# Patient Record
Sex: Male | Born: 1985 | Race: Black or African American | Hispanic: No | Marital: Single | Smoking: Never smoker
Health system: Southern US, Community
[De-identification: ages and names within clinical notes are randomized; demographics above are authoritative.]

---

## 2012-02-26 ENCOUNTER — Encounter: Payer: Self-pay | Admitting: Family Medicine

## 2012-04-26 ENCOUNTER — Ambulatory Visit (INDEPENDENT_AMBULATORY_CARE_PROVIDER_SITE_OTHER): Payer: BC Managed Care – PPO | Admitting: Family Medicine

## 2012-04-26 VITALS — BP 114/77 | HR 68 | Temp 98.0°F | Resp 16 | Ht 70.0 in | Wt 190.0 lb

## 2012-04-26 DIAGNOSIS — Z0000 Encounter for general adult medical examination without abnormal findings: Secondary | ICD-10-CM

## 2012-04-26 LAB — COMPREHENSIVE METABOLIC PANEL
ALT: 21 U/L (ref 0–53)
Albumin: 5.1 g/dL (ref 3.5–5.2)
CO2: 30 mEq/L (ref 19–32)
Calcium: 9.9 mg/dL (ref 8.4–10.5)
Chloride: 100 mEq/L (ref 96–112)
Glucose, Bld: 96 mg/dL (ref 70–99)
Potassium: 4.2 mEq/L (ref 3.5–5.3)
Sodium: 140 mEq/L (ref 135–145)
Total Protein: 8.1 g/dL (ref 6.0–8.3)

## 2012-04-26 LAB — LIPID PANEL
Cholesterol: 215 mg/dL — ABNORMAL HIGH (ref 0–200)
Triglycerides: 123 mg/dL (ref <150)

## 2012-04-26 NOTE — Progress Notes (Signed)
  Subjective:    Patient ID: Allen Poole, male    DOB: 04/21/1986, 26 y.o.   MRN: [id number]  HPI  Patient presents for CPE  1) Vertiginous symptoms X 2 months. Symptoms have resolved in entirely. Occurred after infectious illness.  PMH/ No chronic medical illness            No surgical history             Immunizations UTD(updated prior to immigration 2009)   SH/ Nurse- [facility]; from [place] immigrated in         nonsmoker Review of Systems    See scanned intake form Objective:   Physical Exam  Constitutional: He appears well-developed and well-nourished.  HENT:  Head: Normocephalic and atraumatic.  Right Ear: External ear normal.  Left Ear: External ear normal.  Nose: Nose normal.  Mouth/Throat: Oropharynx is clear and moist.  Eyes: EOM are normal. Pupils are equal, round, and reactive to light.  Neck: Neck supple. No thyromegaly present.  Cardiovascular: Normal rate, regular rhythm and normal heart sounds.   Pulmonary/Chest: Effort normal and breath sounds normal.  Abdominal: Soft. Bowel sounds are normal. He exhibits no mass. There is no hepatosplenomegaly. There is no tenderness. No hernia.  Musculoskeletal: Normal range of motion.  Lymphadenopathy:    He has no cervical adenopathy.  Neurological: He is alert.  Skin: Skin is warm.  Psychiatric: He has a normal mood and affect.           Assessment & Plan:   1. Routine general medical examination at a health care facility  HIV antibody, GC/chlamydia probe amp, urine, RPR, Comprehensive metabolic panel, Lipid panel   Patient interested in blood typing but will pursue this possibly with [facility]  Reassurance and guidance regarding recent vertgo

## 2012-04-27 LAB — RPR

## 2012-04-27 LAB — HIV ANTIBODY (ROUTINE TESTING W REFLEX): HIV: NONREACTIVE

## 2012-04-28 ENCOUNTER — Other Ambulatory Visit: Payer: Self-pay | Admitting: Family Medicine

## 2012-04-28 DIAGNOSIS — A749 Chlamydial infection, unspecified: Secondary | ICD-10-CM

## 2012-04-28 MED ORDER — DOXYCYCLINE HYCLATE 100 MG PO TABS: 100.0000 mg | ORAL_TABLET | Freq: Two times a day (BID) | ORAL | Status: AC

## 2013-08-31 ENCOUNTER — Emergency Department (HOSPITAL_BASED_OUTPATIENT_CLINIC_OR_DEPARTMENT_OTHER)
Admission: EM | Admit: 2013-08-31 | Discharge: 2013-09-01 | Disposition: A | Discharge status: Home / Self Care | Payer: No Typology Code available for payment source | Attending: Emergency Medicine | Admitting: Emergency Medicine

## 2013-08-31 ENCOUNTER — Encounter (HOSPITAL_BASED_OUTPATIENT_CLINIC_OR_DEPARTMENT_OTHER): Payer: Self-pay

## 2013-08-31 ENCOUNTER — Emergency Department (HOSPITAL_BASED_OUTPATIENT_CLINIC_OR_DEPARTMENT_OTHER): Payer: No Typology Code available for payment source

## 2013-08-31 DIAGNOSIS — Y9389 Activity, other specified: Secondary | ICD-10-CM | POA: Insufficient documentation

## 2013-08-31 DIAGNOSIS — S20219A Contusion of unspecified front wall of thorax, initial encounter: Secondary | ICD-10-CM | POA: Insufficient documentation

## 2013-08-31 DIAGNOSIS — S0993XA Unspecified injury of face, initial encounter: Secondary | ICD-10-CM | POA: Insufficient documentation

## 2013-08-31 DIAGNOSIS — S8010XA Contusion of unspecified lower leg, initial encounter: Secondary | ICD-10-CM | POA: Insufficient documentation

## 2013-08-31 DIAGNOSIS — S8990XA Unspecified injury of unspecified lower leg, initial encounter: Secondary | ICD-10-CM | POA: Insufficient documentation

## 2013-08-31 DIAGNOSIS — S3981XA Other specified injuries of abdomen, initial encounter: Secondary | ICD-10-CM | POA: Insufficient documentation

## 2013-08-31 DIAGNOSIS — Y92410 Unspecified street and highway as the place of occurrence of the external cause: Secondary | ICD-10-CM | POA: Insufficient documentation

## 2013-08-31 DIAGNOSIS — S20211A Contusion of right front wall of thorax, initial encounter: Secondary | ICD-10-CM

## 2013-08-31 DIAGNOSIS — IMO0002 Reserved for concepts with insufficient information to code with codable children: Secondary | ICD-10-CM | POA: Insufficient documentation

## 2013-08-31 DIAGNOSIS — S8011XA Contusion of right lower leg, initial encounter: Secondary | ICD-10-CM

## 2013-08-31 NOTE — ED Notes (Signed)
Patient transported to X-ray 
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
## 2013-09-01 MED ORDER — HYDROCODONE-ACETAMINOPHEN 5-325 MG PO TABS
2.0000 | ORAL_TABLET | Freq: Once | ORAL | Status: AC
  Administered 2013-09-01: 2 via ORAL
  Filled 2013-09-01: qty 2

## 2013-09-01 MED ORDER — HYDROCODONE-ACETAMINOPHEN 5-325 MG PO TABS: 2.0000 | ORAL_TABLET | ORAL | Status: DC | PRN

## 2013-09-04 ENCOUNTER — Other Ambulatory Visit: Payer: Self-pay | Admitting: Orthopaedic Surgery

## 2013-09-04 DIAGNOSIS — M542 Cervicalgia: Secondary | ICD-10-CM

## 2013-09-06 ENCOUNTER — Ambulatory Visit
Admission: RE | Admit: 2013-09-06 | Discharge: 2013-09-06 | Disposition: A | Discharge status: Home / Self Care | Payer: No Typology Code available for payment source | Source: Ambulatory Visit | Attending: Orthopaedic Surgery | Admitting: Orthopaedic Surgery

## 2013-09-06 DIAGNOSIS — M542 Cervicalgia: Secondary | ICD-10-CM

## 2015-08-02 ENCOUNTER — Encounter (HOSPITAL_BASED_OUTPATIENT_CLINIC_OR_DEPARTMENT_OTHER): Payer: Self-pay | Admitting: *Deleted

## 2015-08-02 ENCOUNTER — Emergency Department (HOSPITAL_BASED_OUTPATIENT_CLINIC_OR_DEPARTMENT_OTHER): Payer: No Typology Code available for payment source

## 2015-08-02 ENCOUNTER — Emergency Department (HOSPITAL_BASED_OUTPATIENT_CLINIC_OR_DEPARTMENT_OTHER)
Admission: EM | Admit: 2015-08-02 | Discharge: 2015-08-02 | Disposition: A | Discharge status: Home / Self Care | Payer: No Typology Code available for payment source | Attending: Emergency Medicine | Admitting: Emergency Medicine

## 2015-08-02 DIAGNOSIS — S161XXA Strain of muscle, fascia and tendon at neck level, initial encounter: Secondary | ICD-10-CM | POA: Diagnosis not present

## 2015-08-02 DIAGNOSIS — S39012A Strain of muscle, fascia and tendon of lower back, initial encounter: Secondary | ICD-10-CM | POA: Insufficient documentation

## 2015-08-02 DIAGNOSIS — Y9389 Activity, other specified: Secondary | ICD-10-CM | POA: Diagnosis not present

## 2015-08-02 DIAGNOSIS — Y92410 Unspecified street and highway as the place of occurrence of the external cause: Secondary | ICD-10-CM | POA: Insufficient documentation

## 2015-08-02 DIAGNOSIS — Y998 Other external cause status: Secondary | ICD-10-CM | POA: Diagnosis not present

## 2015-08-02 DIAGNOSIS — S199XXA Unspecified injury of neck, initial encounter: Secondary | ICD-10-CM | POA: Diagnosis present

## 2015-08-02 MED ORDER — NAPROXEN 500 MG PO TABS: 500.0000 mg | ORAL_TABLET | Freq: Two times a day (BID) | ORAL | Status: AC

## 2015-08-02 MED ORDER — METHOCARBAMOL 500 MG PO TABS: 500.0000 mg | ORAL_TABLET | Freq: Two times a day (BID) | ORAL | Status: AC
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
## 2015-08-02 NOTE — Discharge Instructions (Signed)
Cervical Sprain °A cervical sprain is an injury in the neck in which the strong, fibrous tissues (ligaments) that connect your neck bones stretch or tear. Cervical sprains can range from mild to severe. Severe cervical sprains can cause the neck vertebrae to be unstable. This can lead to damage of the spinal cord and can result in serious nervous system problems. The amount of time it takes for a cervical sprain to get better depends on the cause and extent of the injury. Most cervical sprains heal in 1 to 3 weeks. °CAUSES  °Severe cervical sprains may be caused by:  °· Contact sport injuries (such as from football, rugby, wrestling, hockey, auto racing, gymnastics, diving, martial arts, or boxing).   °· Motor vehicle collisions.   °· Whiplash injuries. This is an injury from a sudden forward and backward whipping movement of the head and neck.  °· Falls.   °Mild cervical sprains may be caused by:  °· Being in an awkward position, such as while cradling a telephone between your ear and shoulder.   °· Sitting in a chair that does not offer proper support.   °· Working at a poorly designed computer station.   °· Looking up or down for long periods of time.   °SYMPTOMS  °· Pain, soreness, stiffness, or a burning sensation in the front, back, or sides of the neck. This discomfort may develop immediately after the injury or slowly, 24 hours or more after the injury.   °· Pain or tenderness directly in the middle of the back of the neck.   °· Shoulder or upper back pain.   °· Limited ability to move the neck.   °· Headache.   °· Dizziness.   °· Weakness, numbness, or tingling in the hands or arms.   °· Muscle spasms.   °· Difficulty swallowing or chewing.   °· Tenderness and swelling of the neck.   °DIAGNOSIS  °Most of the time your health care provider can diagnose a cervical sprain by taking your history and doing a physical exam. Your health care provider will ask about previous neck injuries and any known neck  problems, such as arthritis in the neck. X-rays may be taken to find out if there are any other problems, such as with the bones of the neck. Other tests, such as a CT scan or MRI, may also be needed.  °TREATMENT  °Treatment depends on the severity of the cervical sprain. Mild sprains can be treated with rest, keeping the neck in place (immobilization), and pain medicines. Severe cervical sprains are immediately immobilized. Further treatment is done to help with pain, muscle spasms, and other symptoms and may include: °· Medicines, such as pain relievers, numbing medicines, or muscle relaxants.   °· Physical therapy. This may involve stretching exercises, strengthening exercises, and posture training. Exercises and improved posture can help stabilize the neck, strengthen muscles, and help stop symptoms from returning.   °HOME CARE INSTRUCTIONS  °· Put ice on the injured area.   °· Put ice in a plastic bag.   °· Place a towel between your skin and the bag.   °· Leave the ice on for 15-20 minutes, 3-4 times a day.   °· If your injury was severe, you may have been given a cervical collar to wear. A cervical collar is a two-piece collar designed to keep your neck from moving while it heals. °· Do not remove the collar unless instructed by your health care provider. °· If you have long hair, keep it outside of the collar. °· Ask your health care provider before making any adjustments to your collar. Minor   adjustments may be required over time to improve comfort and reduce pressure on your chin or on the back of your head. °· If you are allowed to remove the collar for cleaning or bathing, follow your health care provider's instructions on how to do so safely. °· Keep your collar clean by wiping it with mild soap and water and drying it completely. If the collar you have been given includes removable pads, remove them every 1-2 days and hand wash them with soap and water. Allow them to air dry. They should be completely  dry before you wear them in the collar. °· If you are allowed to remove the collar for cleaning and bathing, wash and dry the skin of your neck. Check your skin for irritation or sores. If you see any, tell your health care provider. °· Do not drive while wearing the collar.   °· Only take over-the-counter or prescription medicines for pain, discomfort, or fever as directed by your health care provider.   °· Keep all follow-up appointments as directed by your health care provider.   °· Keep all physical therapy appointments as directed by your health care provider.   °· Make any needed adjustments to your workstation to promote good posture.   °· Avoid positions and activities that make your symptoms worse.   °· Warm up and stretch before being active to help prevent problems.   °SEEK MEDICAL CARE IF:  °· Your pain is not controlled with medicine.   °· You are unable to decrease your pain medicine over time as planned.   °· Your activity level is not improving as expected.   °SEEK IMMEDIATE MEDICAL CARE IF:  °· You develop any bleeding. °· You develop stomach upset. °· You have signs of an allergic reaction to your medicine.   °· Your symptoms get worse.   °· You develop new, unexplained symptoms.   °· You have numbness, tingling, weakness, or paralysis in any part of your body.   °MAKE SURE YOU:  °· Understand these instructions. °· Will watch your condition. °· Will get help right away if you are not doing well or get worse. °Document Released: 09/27/2007 Document Revised: 12/05/2013 Document Reviewed: 06/07/2013 °[employer]® Patient Information ©2015 [employer]. This information is not intended to replace advice given to you by your health care provider. Make sure you discuss any questions you have with your health care provider. ° °Lumbosacral Strain °Lumbosacral strain is a strain of any of the parts that make up your lumbosacral vertebrae. Your lumbosacral vertebrae are the bones that make up the lower third  of your backbone. Your lumbosacral vertebrae are held together by muscles and tough, fibrous tissue (ligaments).  °CAUSES  °A sudden blow to your back can cause lumbosacral strain. Also, anything that causes an excessive stretch of the muscles in the low back can cause this strain. This is typically seen when people exert themselves strenuously, fall, lift heavy objects, bend, or crouch repeatedly. °RISK FACTORS °· Physically demanding work. °· Participation in pushing or pulling sports or sports that require a sudden twist of the back (tennis, golf, baseball). °· Weight lifting. °· Excessive lower back curvature. °· Forward-tilted pelvis. °· Weak back or abdominal muscles or both. °· Tight hamstrings. °SIGNS AND SYMPTOMS  °Lumbosacral strain may cause pain in the area of your injury or pain that moves (radiates) down your leg.  °DIAGNOSIS °Your health care provider can often diagnose lumbosacral strain through a physical exam. In some cases, you may need tests such as X-ray exams.  °TREATMENT  °Treatment for your lower   back injury depends on many factors that your clinician will have to evaluate. However, most treatment will include the use of anti-inflammatory medicines. °HOME CARE INSTRUCTIONS  °· Avoid hard physical activities (tennis, racquetball, waterskiing) if you are not in proper physical condition for it. This may aggravate or create problems. °· If you have a back problem, avoid sports requiring sudden body movements. Swimming and walking are generally safer activities. °· Maintain good posture. °· Maintain a healthy weight. °· For acute conditions, you may put ice on the injured area. °¨ Put ice in a plastic bag. °¨ Place a towel between your skin and the bag. °¨ Leave the ice on for 20 minutes, 2-3 times a day. °· When the low back starts healing, stretching and strengthening exercises may be recommended. °SEEK MEDICAL CARE IF: °· Your back pain is getting worse. °· You experience severe back pain not  relieved with medicines. °SEEK IMMEDIATE MEDICAL CARE IF:  °· You have numbness, tingling, weakness, or problems with the use of your arms or legs. °· There is a change in bowel or bladder control. °· You have increasing pain in any area of the body, including your belly (abdomen). °· You notice shortness of breath, dizziness, or feel faint. °· You feel sick to your stomach (nauseous), are throwing up (vomiting), or become sweaty. °· You notice discoloration of your toes or legs, or your feet get very cold. °MAKE SURE YOU:  °· Understand these instructions. °· Will watch your condition. °· Will get help right away if you are not doing well or get worse. °Document Released: 09/09/2005 Document Revised: 12/05/2013 Document Reviewed: 07/19/2013 °Exit[employer]® Patient Information ©2015 [employer]. This information is not intended to replace advice given to you by your health care provider. Make sure you discuss any questions you have with your health care provider. ° °

## 2015-08-24 ENCOUNTER — Encounter (HOSPITAL_BASED_OUTPATIENT_CLINIC_OR_DEPARTMENT_OTHER): Payer: Self-pay | Admitting: Emergency Medicine

## 2015-08-24 ENCOUNTER — Emergency Department (HOSPITAL_BASED_OUTPATIENT_CLINIC_OR_DEPARTMENT_OTHER)
Admission: EM | Admit: 2015-08-24 | Discharge: 2015-08-24 | Disposition: A | Discharge status: Home / Self Care | Payer: No Typology Code available for payment source | Attending: Emergency Medicine | Admitting: Emergency Medicine

## 2015-08-24 DIAGNOSIS — Y92410 Unspecified street and highway as the place of occurrence of the external cause: Secondary | ICD-10-CM | POA: Diagnosis not present

## 2015-08-24 DIAGNOSIS — S8991XA Unspecified injury of right lower leg, initial encounter: Secondary | ICD-10-CM | POA: Diagnosis not present

## 2015-08-24 DIAGNOSIS — G8929 Other chronic pain: Secondary | ICD-10-CM | POA: Insufficient documentation

## 2015-08-24 DIAGNOSIS — M25561 Pain in right knee: Secondary | ICD-10-CM

## 2015-08-24 DIAGNOSIS — Y998 Other external cause status: Secondary | ICD-10-CM | POA: Diagnosis not present

## 2015-08-24 DIAGNOSIS — Y9389 Activity, other specified: Secondary | ICD-10-CM | POA: Insufficient documentation

## 2015-08-24 MED ORDER — METHOCARBAMOL 500 MG PO TABS
1000.0000 mg | ORAL_TABLET | Freq: Once | ORAL | Status: AC
  Administered 2015-08-24: 1000 mg via ORAL
  Filled 2015-08-24: qty 2

## 2015-08-24 MED ORDER — MELOXICAM 15 MG PO TABS: 15.0000 mg | ORAL_TABLET | Freq: Every day | ORAL | Status: AC

## 2015-08-24 MED ORDER — NAPROXEN 250 MG PO TABS
500.0000 mg | ORAL_TABLET | Freq: Once | ORAL | Status: AC
  Administered 2015-08-24: 500 mg via ORAL
  Filled 2015-08-24: qty 2

## 2015-08-24 MED ORDER — METHOCARBAMOL 500 MG PO TABS: 500.0000 mg | ORAL_TABLET | Freq: Two times a day (BID) | ORAL | Status: AC

## 2015-08-24 NOTE — Discharge Instructions (Signed)
Cryotherapy °Cryotherapy means treatment with cold. Ice or gel packs can be used to reduce both pain and swelling. Ice is the most helpful within the first 24 to 48 hours after an injury or flare-up from overusing a muscle or joint. Sprains, strains, spasms, burning pain, shooting pain, and aches can all be eased with ice. Ice can also be used when recovering from surgery. Ice is effective, has very few side effects, and is safe for most people to use. °PRECAUTIONS  °Ice is not a safe treatment option for people with: °· Raynaud phenomenon. This is a condition affecting small blood vessels in the extremities. Exposure to cold may cause your problems to return. °· Cold hypersensitivity. There are many forms of cold hypersensitivity, including: °¨ Cold urticaria. Red, itchy hives appear on the skin when the tissues begin to warm after being iced. °¨ Cold erythema. This is a red, itchy rash caused by exposure to cold. °¨ Cold hemoglobinuria. Red blood cells break down when the tissues begin to warm after being iced. The hemoglobin that carry oxygen are passed into the urine because they cannot combine with blood proteins fast enough. °· Numbness or altered sensitivity in the area being iced. °If you have any of the following conditions, do not use ice until you have discussed cryotherapy with your caregiver: °· Heart conditions, such as arrhythmia, angina, or chronic heart disease. °· High blood pressure. °· Healing wounds or open skin in the area being iced. °· Current infections. °· Rheumatoid arthritis. °· Poor circulation. °· Diabetes. °Ice slows the blood flow in the region it is applied. This is beneficial when trying to stop inflamed tissues from spreading irritating chemicals to surrounding tissues. However, if you expose your skin to cold temperatures for too long or without the proper protection, you can damage your skin or nerves. Watch for signs of skin damage due to cold. °HOME CARE INSTRUCTIONS °Follow  these tips to use ice and cold packs safely. °· Place a dry or damp towel between the ice and skin. A damp towel will cool the skin more quickly, so you may need to shorten the time that the ice is used. °· For a more rapid response, add gentle compression to the ice. °· Ice for no more than 10 to 20 minutes at a time. The bonier the area you are icing, the less time it will take to get the benefits of ice. °· Check your skin after 5 minutes to make sure there are no signs of a poor response to cold or skin damage. °· Rest 20 minutes or more between uses. °· Once your skin is numb, you can end your treatment. You can test numbness by very lightly touching your skin. The touch should be so light that you do not see the skin dimple from the pressure of your fingertip. When using ice, most people will feel these normal sensations in this order: cold, burning, aching, and numbness. °· Do not use ice on someone who cannot communicate their responses to pain, such as small children or people with dementia. °HOW TO MAKE AN ICE PACK °Ice packs are the most common way to use ice therapy. Other methods include ice massage, ice baths, and cryosprays. Muscle creams that cause a cold, tingly feeling do not offer the same benefits that ice offers and should not be used as a substitute unless recommended by your caregiver. °To make an ice pack, do one of the following: °· Place crushed ice or a   bag of frozen vegetables in a sealable plastic bag. Squeeze out the excess air. Place this bag inside another plastic bag. Slide the bag into a pillowcase or place a damp towel between your skin and the bag. °· Mix 3 parts water with 1 part rubbing alcohol. Freeze the mixture in a sealable plastic bag. When you remove the mixture from the freezer, it will be slushy. Squeeze out the excess air. Place this bag inside another plastic bag. Slide the bag into a pillowcase or place a damp towel between your skin and the bag. °SEEK MEDICAL CARE  IF: °· You develop white spots on your skin. This may give the skin a blotchy (mottled) appearance. °· Your skin turns blue or pale. °· Your skin becomes waxy or hard. °· Your swelling gets worse. °MAKE SURE YOU:  °· Understand these instructions. °· Will watch your condition. °· Will get help right away if you are not doing well or get worse. °Document Released: 07/27/2011 Document Revised: 04/16/2014 Document Reviewed: 07/27/2011 °[employer]® Patient Information ©2015 [employer]. This information is not intended to replace advice given to you by your health care provider. Make sure you discuss any questions you have with your health care provider. ° °
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
## 2015-08-25 ENCOUNTER — Encounter (HOSPITAL_BASED_OUTPATIENT_CLINIC_OR_DEPARTMENT_OTHER): Payer: Self-pay | Admitting: Emergency Medicine
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
## 2018-07-09 ENCOUNTER — Emergency Department (HOSPITAL_COMMUNITY): Payer: Self-pay

## 2018-07-09 ENCOUNTER — Emergency Department (HOSPITAL_COMMUNITY)
Admission: EM | Admit: 2018-07-09 | Discharge: 2018-07-09 | Disposition: A | Discharge status: Home / Self Care | Payer: Self-pay | Attending: Emergency Medicine | Admitting: Emergency Medicine

## 2018-07-09 ENCOUNTER — Encounter (HOSPITAL_COMMUNITY): Payer: Self-pay

## 2018-07-09 DIAGNOSIS — S51811A Laceration without foreign body of right forearm, initial encounter: Secondary | ICD-10-CM | POA: Insufficient documentation

## 2018-07-09 DIAGNOSIS — Y939 Activity, unspecified: Secondary | ICD-10-CM | POA: Insufficient documentation

## 2018-07-09 DIAGNOSIS — W268XXA Contact with other sharp object(s), not elsewhere classified, initial encounter: Secondary | ICD-10-CM | POA: Insufficient documentation

## 2018-07-09 DIAGNOSIS — Z79899 Other long term (current) drug therapy: Secondary | ICD-10-CM | POA: Insufficient documentation

## 2018-07-09 DIAGNOSIS — Y999 Unspecified external cause status: Secondary | ICD-10-CM | POA: Insufficient documentation

## 2018-07-09 DIAGNOSIS — Y929 Unspecified place or not applicable: Secondary | ICD-10-CM | POA: Insufficient documentation

## 2018-07-09 MED ORDER — IBUPROFEN 400 MG PO TABS
600.0000 mg | ORAL_TABLET | Freq: Once | ORAL | Status: AC
  Administered 2018-07-09: 15:00:00 600 mg via ORAL
  Filled 2018-07-09: qty 1

## 2018-07-09 MED ORDER — LIDOCAINE-EPINEPHRINE-TETRACAINE (LET) SOLUTION
6.0000 mL | Freq: Once | NASAL | Status: AC
  Administered 2018-07-09: 15:00:00 6 mL via TOPICAL
  Filled 2018-07-09: qty 6

## 2018-07-09 MED ORDER — LIDOCAINE-EPINEPHRINE (PF) 2 %-1:200000 IJ SOLN
10.0000 mL | Freq: Once | INTRAMUSCULAR | Status: AC
  Administered 2018-07-09: 10 mL
  Filled 2018-07-09: qty 20

## 2018-07-09 MED ORDER — HYDROCODONE-ACETAMINOPHEN 5-325 MG PO TABS: 1.0000 | ORAL_TABLET | Freq: Four times a day (QID) | ORAL | 0 refills | Status: AC | PRN

## 2018-07-09 MED ORDER — CEPHALEXIN 500 MG PO CAPS: 500.0000 mg | ORAL_CAPSULE | Freq: Four times a day (QID) | ORAL | 0 refills | Status: AC

## 2018-07-09 MED ORDER — TETANUS-DIPHTH-ACELL PERTUSSIS 5-2.5-18.5 LF-MCG/0.5 IM SUSP
0.5000 mL | Freq: Once | INTRAMUSCULAR | Status: AC
  Administered 2018-07-09: 0.5 mL via INTRAMUSCULAR
  Filled 2018-07-09: qty 0.5
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
## 2018-07-09 NOTE — ED Provider Notes (Signed)
[facility]   CSN: [id number] Arrival date & time: 07/09/18  1415     History   Chief Complaint Chief Complaint  Patient presents with  . Laceration    HPI Allen Poole is a 32 y.o. male with no past medical history who presents today for evaluation of a right forearm laceration.  He reports that he was working on his car when he caught his arm on his fender causing a cut.  The story is corroborated by his father.  Patient denies any numbness, tingling, or weakness of his right hand.  He is right-hand dominant.  He is unsure when his last tetanus shot was.  No interventions for pain tried prior to arrival.  Denies any allergies to medications.    HPI  History reviewed. No pertinent past medical history.  There are no active problems to display for this patient.   History reviewed. No pertinent surgical history.      Home Medications    Prior to Admission medications   Medication Sig Start Date End Date Taking? Authorizing Provider  cephALEXin (KEFLEX) 500 MG capsule Take 1 capsule (500 mg total) by mouth 4 (four) times daily for 7 days. 07/09/18 07/16/18  Cristina GongHammond, Kalie Cabral W, PA-C  HYDROcodone-acetaminophen (NORCO/VICODIN) 5-325 MG tablet Take 1 tablet by mouth every 6 (six) hours as needed for severe pain. 07/09/18   Cristina GongHammond, Fionnuala Hemmerich W, PA-C  meloxicam (MOBIC) 15 MG tablet Take 1 tablet (15 mg total) by mouth daily. 08/24/15   Palumbo, April, MD  methocarbamol (ROBAXIN) 500 MG tablet Take 1 tablet (500 mg total) by mouth 2 (two) times daily. 08/02/15   Rolland PorterJames, Mark, MD  methocarbamol (ROBAXIN) 500 MG tablet Take 1 tablet (500 mg total) by mouth 2 (two) times daily. 08/24/15   Palumbo, April, MD  naproxen (NAPROSYN) 500 MG tablet Take 1 tablet (500 mg total) by mouth 2 (two) times daily. 08/02/15   Rolland PorterJames, Mark, MD    Family History History reviewed. No pertinent family history.  Social History Social History   Tobacco  Use  . Smoking status: Never Smoker  . Smokeless tobacco: Never Used  Substance Use Topics  . Alcohol use: No  . Drug use: No     Allergies   Patient has no known allergies.   Review of Systems Review of Systems  Constitutional: Negative for fever.  Skin: Negative for color change and rash.  Neurological: Negative for weakness and numbness.  All other systems reviewed and are negative.    Physical Exam Updated Vital Signs BP 121/75 (BP Location: Left Arm)   Pulse 88   Temp 98.8 F (37.1 C) (Oral)   Resp 18   SpO2 100%   Physical Exam  Constitutional: He appears well-developed. No distress.  Cardiovascular: Intact distal pulses.  2+ right radial pulses.  Right fingers are warm, brisk capillary refill.  Musculoskeletal:  5/5 strength in right wrist, right fingers, and right grip strength.  Neurological:  Sensation intact to right hand, and right fingers.  Skin: He is not diaphoretic.  There is a 5 cm laceration on patient's right anterior forearm medial to the radial artery pulsation.  There are no tendons visible in the wound.  Wound appears to only extend into subcutaneous tissue, without violating fascia.  No foreign bodies present.  Psychiatric: He has a normal mood and affect. His behavior is normal.  Nursing note and vitals reviewed.    ED Treatments / Results  Labs (all  labs ordered are listed, but only abnormal results are displayed) Labs Reviewed - No data to display  EKG None  Radiology Dg Forearm Right  Result Date: 07/09/2018 CLINICAL DATA:  Recent forearm laceration, initial encounter EXAM: RIGHT FOREARM - 2 VIEW COMPARISON:  None. FINDINGS: There is no evidence of fracture or other focal bone lesions. Soft tissues are unremarkable. IMPRESSION: No acute abnormality noted. Electronically Signed   By: Alcide Clever M.D.   On: 07/09/2018 15:31    Procedures .Marland KitchenLaceration Repair Date/Time: 07/09/2018 5:12 PM Performed by: Cristina Gong,  PA-C Authorized by: Cristina Gong, PA-C   Consent:    Consent obtained:  Verbal   Consent given by:  Patient   Risks discussed:  Infection, need for additional repair, poor cosmetic result, pain, retained foreign body, tendon damage, vascular damage, poor wound healing and nerve damage   Alternatives discussed:  No treatment and referral (Alternative wound closures) Anesthesia (see MAR for exact dosages):    Anesthesia method:  Topical application   Topical anesthetic:  LET Laceration details:    Location:  Shoulder/arm   Shoulder/arm location:  R lower arm   Length (cm):  5 Repair type:    Repair type:  Intermediate Pre-procedure details:    Preparation:  Patient was prepped and draped in usual sterile fashion and imaging obtained to evaluate for foreign bodies Exploration:    Hemostasis achieved with:  LET and direct pressure   Wound exploration: wound explored through full range of motion and entire depth of wound probed and visualized     Wound extent: areolar tissue violated     Wound extent: no fascia violation noted, no foreign bodies/material noted, no muscle damage noted, no nerve damage noted, no tendon damage noted, no underlying fracture noted and no vascular damage noted     Contaminated: yes   Treatment:    Area cleansed with:  Saline   Amount of cleaning:  Extensive   Irrigation solution:  Sterile saline   Irrigation volume:  1 liter   Irrigation method:  Pressure wash Skin repair:    Repair method:  Sutures   Suture size:  5-0   Suture material:  Prolene   Suture technique:  Simple interrupted and horizontal mattress   Number of sutures:  10 Approximation:    Approximation:  Close Post-procedure details:    Dressing:  Antibiotic ointment, bulky dressing and non-adherent dressing   Patient tolerance of procedure:  Tolerated well, no immediate complications Comments:     No arterial injury created or noticed during repair.   (including critical care  time)  Medications Ordered in ED Medications  Tdap (BOOSTRIX) injection 0.5 mL (0.5 mLs Intramuscular Given 07/09/18 1521)  lidocaine-EPINEPHrine-tetracaine (LET) solution (6 mLs Topical Given 07/09/18 1521)  ibuprofen (ADVIL,MOTRIN) tablet 600 mg (600 mg Oral Given 07/09/18 1521)  lidocaine-EPINEPHrine (XYLOCAINE W/EPI) 2 %-1:200000 (PF) injection 10 mL (10 mLs Infiltration Given 07/09/18 1550)     Initial Impression / Assessment and Plan / ED Course  I have reviewed the triage vital signs and the nursing notes.  Pertinent labs & imaging results that were available during my care of the patient were reviewed by me and considered in my medical decision making (see chart for details).    Pressure irrigation performed.  X-ray was obtained without radiopaque obtained foreign body.  Wound was reviewed by Dr. Rush Landmark prior to repair due to the close proximity to the radial artery.  Wound explored and base of wound visualized in  a bloodless field without evidence of foreign body.  Laceration occurred < 8 hours prior to repair which was well tolerated.  Tdap updated.  Pt has  no comorbidities to effect normal wound healing. Pt discharged with antibiotics.  Discussed suture home care with patient and answered questions. Pt to follow-up for wound check and suture removal in 14 days; they are to return to the ED sooner for signs of infection. Pt is hemodynamically stable with no complaints prior to dc.   Final Clinical Impressions(s) / ED Diagnoses   Final diagnoses:  Laceration of right forearm, initial encounter    ED Discharge Orders        Ordered    cephALEXin (KEFLEX) 500 MG capsule  4 times daily     07/09/18 1645    HYDROcodone-acetaminophen (NORCO/VICODIN) 5-325 MG tablet  Every 6 hours PRN     07/09/18 1645       Cristina Gong, [place] 07/09/18 1715    Tegeler, Canary Brim, MD 07/09/18 1744

## 2018-07-09 NOTE — ED Notes (Signed)
ED Provider at bedside. 

## 2018-07-09 NOTE — Discharge Instructions (Signed)
Please take Ibuprofen (Advil, motrin) and Tylenol (acetaminophen) to relieve your pain.  You may take up to 600 MG (3 pills) of normal strength ibuprofen every 8 hours as needed.  In between doses of ibuprofen you make take tylenol, up to 1,000 mg (two extra strength pills).  Do not take more than 3,000 mg tylenol in a 24 hour period.  Please check all medication labels as many medications such as pain and cold medications may contain tylenol.  Do not drink alcohol while taking these medications.  Do not take other NSAID'S while taking ibuprofen (such as aleve or naproxen).  Please take ibuprofen with food to decrease stomach upset. ° °You are being prescribed a medication which may make you sleepy. For 24 hours after one dose please do not drive, operate heavy machinery, care for a small child with out another adult present, or perform any activities that may cause harm to you or someone else if you were to fall asleep or be impaired.  ° °You may have diarrhea from the antibiotics.  It is very important that you continue to take the antibiotics even if you get diarrhea unless a medical professional tells you that you may stop taking them.  If you stop too early the bacteria you are being treated for will become stronger and you may need different, more powerful antibiotics that have more side effects and worsening diarrhea.  Please stay well hydrated and consider probiotics as they may decrease the severity of your diarrhea.   °

## 2018-07-09 NOTE — ED Notes (Signed)
Declined W/C at D/C and was escorted to lobby by RN. 

## 2019-11-30 IMAGING — DX DG FOREARM 2V*R*
2 series · 2 of 2 positions shown · non-contrast
Comparison: None.

CLINICAL DATA: Recent forearm laceration, initial encounter

EXAM:
RIGHT FOREARM - 2 VIEW

[forearm ap]
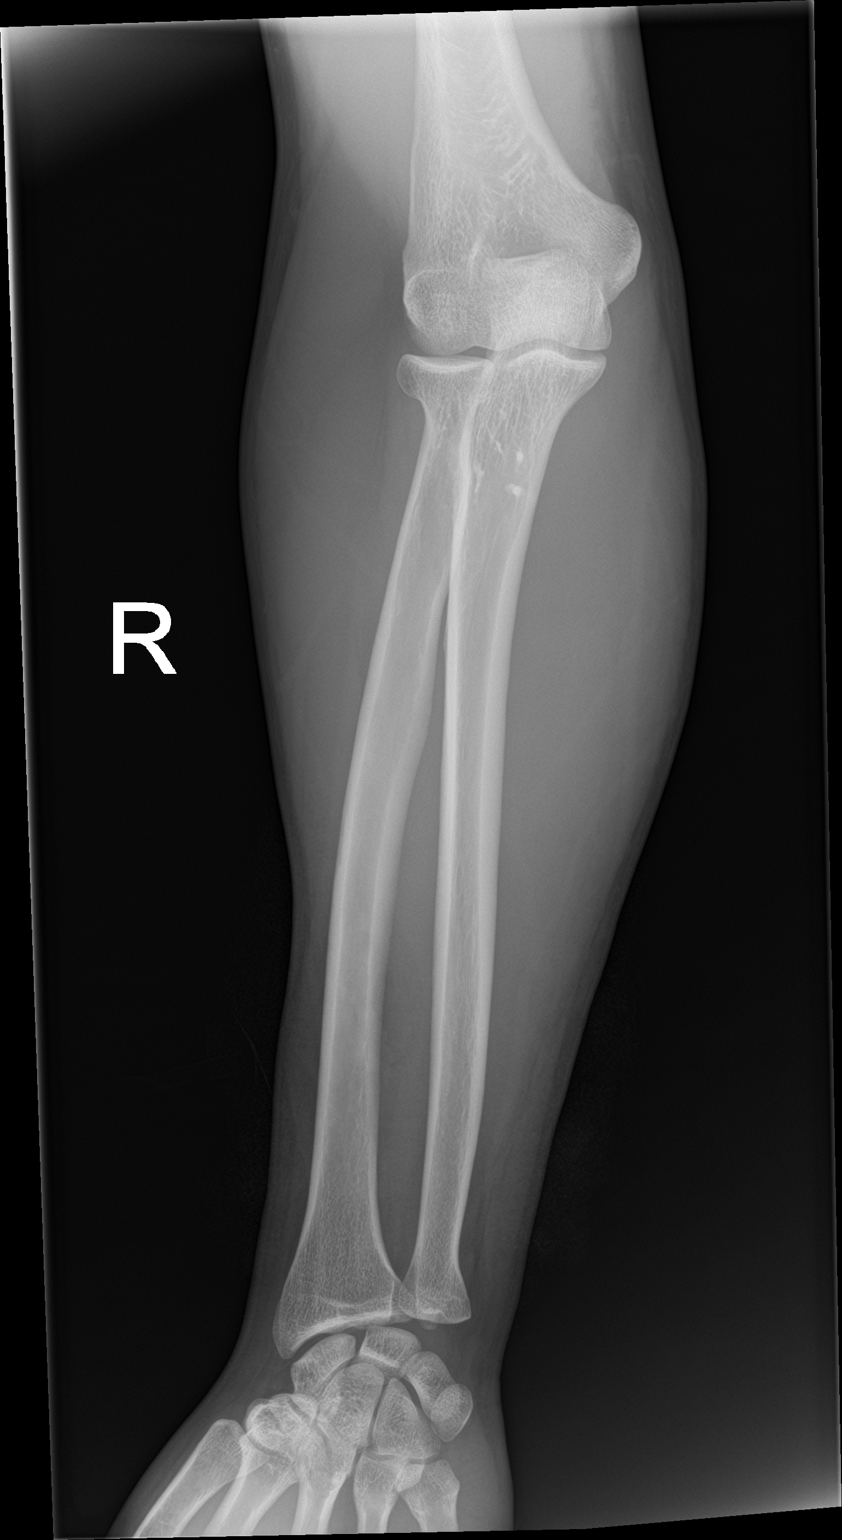

[forearm lat]
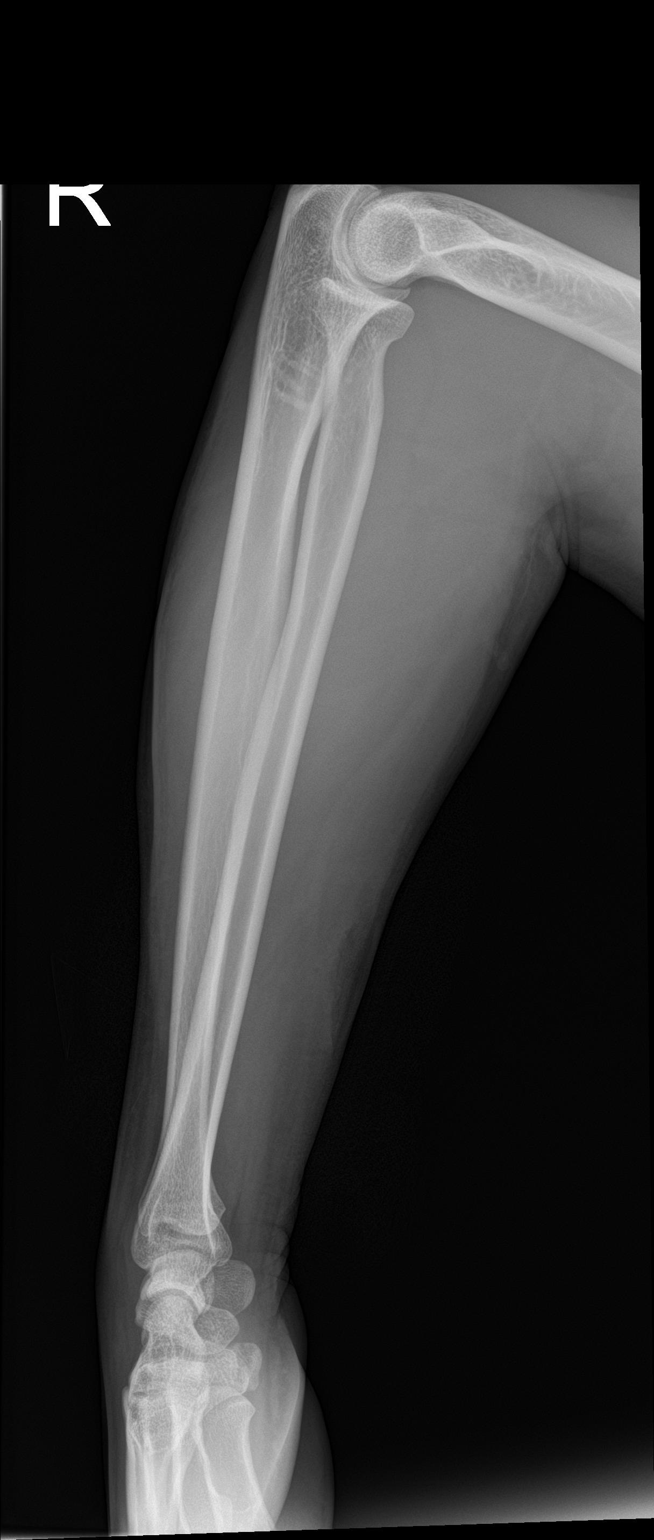

[2 of 2 positions shown; findings below may reference images not displayed]

FINDINGS: There is no evidence of fracture or other focal bone lesions. Soft
tissues are unremarkable.
IMPRESSION: No acute abnormality noted.

## 2024-08-19 ENCOUNTER — Encounter (HOSPITAL_COMMUNITY): Payer: Self-pay

## 2024-08-19 ENCOUNTER — Emergency Department (HOSPITAL_COMMUNITY): Payer: MEDICAID

## 2024-08-19 ENCOUNTER — Other Ambulatory Visit: Payer: Self-pay

## 2024-08-19 ENCOUNTER — Emergency Department (HOSPITAL_COMMUNITY)
Admission: EM | Admit: 2024-08-19 | Discharge: 2024-08-19 | Disposition: A | Discharge status: Home / Self Care | Payer: MEDICAID | Attending: Emergency Medicine | Admitting: Emergency Medicine

## 2024-08-19 DIAGNOSIS — J069 Acute upper respiratory infection, unspecified: Secondary | ICD-10-CM | POA: Insufficient documentation

## 2024-08-19 DIAGNOSIS — R079 Chest pain, unspecified: Secondary | ICD-10-CM | POA: Insufficient documentation

## 2024-08-19 LAB — CBC
HCT: 41.3 % (ref 39.0–52.0)
Hemoglobin: 14.2 g/dL (ref 13.0–17.0)
MCH: 30.2 pg (ref 26.0–34.0)
MCHC: 34.4 g/dL (ref 30.0–36.0)
MCV: 87.9 fL (ref 80.0–100.0)
Platelets: 276 K/uL (ref 150–400)
RBC: 4.7 MIL/uL (ref 4.22–5.81)
RDW: 11.5 % (ref 11.5–15.5)
WBC: 5.6 K/uL (ref 4.0–10.5)
nRBC: 0 % (ref 0.0–0.2)

## 2024-08-19 LAB — RESP PANEL BY RT-PCR (RSV, FLU A&B, COVID)  RVPGX2
Influenza A by PCR: NEGATIVE
Influenza B by PCR: NEGATIVE
Resp Syncytial Virus by PCR: NEGATIVE
SARS Coronavirus 2 by RT PCR: NEGATIVE

## 2024-08-19 LAB — BASIC METABOLIC PANEL WITH GFR
Anion gap: 10 (ref 5–15)
BUN: 14 mg/dL (ref 6–20)
CO2: 29 mmol/L (ref 22–32)
Calcium: 9 mg/dL (ref 8.9–10.3)
Chloride: 102 mmol/L (ref 98–111)
Creatinine, Ser: 1.19 mg/dL (ref 0.61–1.24)
GFR, Estimated: >60 mL/min (ref >60)
Glucose, Bld: 141 mg/dL — ABNORMAL HIGH (ref 70–99)
Potassium: 3.9 mmol/L (ref 3.5–5.1)
Sodium: 141 mmol/L (ref 135–145)

## 2024-08-19 LAB — TROPONIN I (HIGH SENSITIVITY): Troponin I (High Sensitivity): 4 ng/L (ref <18)

## 2024-08-19 MED ORDER — ALBUTEROL SULFATE HFA 108 (90 BASE) MCG/ACT IN AERS
2.0000 | INHALATION_SPRAY | RESPIRATORY_TRACT | Status: DC | PRN
  Filled 2024-08-19: qty 6.7

## 2024-08-19 MED ORDER — AEROCHAMBER PLUS FLO-VU MEDIUM MISC: 1.0000 | Freq: Once | Status: DC

## 2024-08-19 MED ORDER — ACETAMINOPHEN 325 MG PO TABS
650.0000 mg | ORAL_TABLET | Freq: Once | ORAL | Status: AC | PRN
  Administered 2024-08-19: 650 mg via ORAL
  Filled 2024-08-19: qty 2

## 2024-08-19 MED ORDER — GUAIFENESIN-DM 100-10 MG/5ML PO SYRP: 5.0000 mL | ORAL_SOLUTION | Freq: Three times a day (TID) | ORAL | 0 refills | Status: AC | PRN

## 2024-08-19 NOTE — ED Triage Notes (Signed)
 Pt bib pov c/o cough and right sided chest pain that is tight in nature. Symptoms started last night. Pt took some cold medicine with minimal relief.   Pt denies SOB, N/V, and back pain.

## 2024-08-19 NOTE — ED Triage Notes (Signed)
 Pt in with R chest pain x 3 days, worsened over the night. Pt states pain is tight and worse with coughing.

## 2024-08-19 NOTE — ED Provider Notes (Signed)
 [place] EMERGENCY DEPARTMENT AT [facility] Provider Note   CSN: [id number] Arrival date & time: 08/19/24  9464     Patient presents with: Chest Pain   Allen Poole is a 38 y.o. male.    Chest Pain Patient presents with chest pain.  For the last 3 to 4 days has had a cough.  Some chills.  Occasional sputum production.  Last night began to have more tightness on the right side of his chest.  Presents with fever.  No underlying lung issues.  No definite sick contacts.  No dysuria.    History reviewed. No pertinent past medical history.  Prior to Admission medications   Medication Sig Start Date End Date Taking? Authorizing Provider  guaiFENesin -dextromethorphan (ROBITUSSIN DM) 100-10 MG/5ML syrup Take 5 mLs by mouth 3 (three) times daily as needed for cough. 08/19/24  Yes Patsey Lot, MD  HYDROcodone -acetaminophen  (NORCO/VICODIN) 5-325 MG tablet Take 1 tablet by mouth every 6 (six) hours as needed for severe pain. 07/09/18   Windle Almarie ORN, PA-C  meloxicam  (MOBIC ) 15 MG tablet Take 1 tablet (15 mg total) by mouth daily. 08/24/15   Palumbo, April, MD  methocarbamol  (ROBAXIN ) 500 MG tablet Take 1 tablet (500 mg total) by mouth 2 (two) times daily. 08/02/15   Lynwood Anes, MD  methocarbamol  (ROBAXIN ) 500 MG tablet Take 1 tablet (500 mg total) by mouth 2 (two) times daily. 08/24/15   Palumbo, April, MD  naproxen  (NAPROSYN ) 500 MG tablet Take 1 tablet (500 mg total) by mouth 2 (two) times daily. 08/02/15   Lynwood Anes, MD    Allergies: Patient has no known allergies.    Review of Systems  Cardiovascular:  Positive for chest pain.    Updated Vital Signs BP 135/82 (BP Location: Right Arm)   Pulse 70   Temp 99.4 F (37.4 C) (Oral)   Resp 18   Ht 5' 11 (1.803 m)   Wt 78.9 kg   SpO2 100%   BMI 24.27 kg/m   Physical Exam Vitals and nursing note reviewed.  Cardiovascular:     Rate and Rhythm: Normal rate.  Pulmonary:     Breath sounds: Rhonchi present.   Chest:     Chest wall: No tenderness.  Abdominal:     Tenderness: There is no abdominal tenderness.  Neurological:     Mental Status: He is alert.     (all labs ordered are listed, but only abnormal results are displayed) Labs Reviewed  BASIC METABOLIC PANEL WITH GFR - Abnormal; Notable for the following components:      Result Value   Glucose, Bld 141 (*)    All other components within normal limits  RESP PANEL BY RT-PCR (RSV, FLU A&B, COVID)  [id number]  CBC  TROPONIN I (HIGH SENSITIVITY)    EKG: None  Radiology: DG Chest 2 View Result Date: 08/19/2024 CLINICAL DATA:  Chest pain EXAM: CHEST - 2 VIEW COMPARISON:  08/31/2013 FINDINGS: The lungs are clear without focal pneumonia, edema, pneumothorax or pleural effusion. The cardiopericardial silhouette is within normal limits for size. No acute bony abnormality. IMPRESSION: No active cardiopulmonary disease. Electronically Signed   By: Camellia Candle M.D.   On: 08/19/2024 06:12     Procedures   Medications Ordered in the ED  albuterol  (VENTOLIN  HFA) 108 (90 Base) MCG/ACT inhaler 2 puff (has no administration in time range)  AeroChamber Plus Flo-Vu Medium MISC 1 each (has no administration in time range)  acetaminophen  (TYLENOL ) tablet 650 mg (650 mg  Oral Given 08/19/24 0551)                                    Medical Decision Making Risk OTC drugs. Prescription drug management.   Patient with right-sided chest pain.  Cough.  Has somewhat diffuse rhonchi on exam.  Not hypoxic however.  Has negative x-ray for pneumonia.  Negative flu and COVID testing.  Has defervesced.  Not localizing findings on lung exam.  Do not think we need to treat for pneumonia.  Will treat symptomatically.  Appears stable for discharge home.  Will have her give inhaler to help hopefully with some of the breathing.     Final diagnoses:  Upper respiratory tract infection, unspecified type    ED Discharge Orders          Ordered     guaiFENesin -dextromethorphan (ROBITUSSIN DM) 100-10 MG/5ML syrup  3 times daily PRN        08/19/24 0731               Patsey Lot, MD 08/19/24 [phone]
